# Patient Record
Sex: Male | Born: 1970 | Race: White | Hispanic: No | Marital: Married | Smoking: Former smoker
Health system: Southern US, Community
[De-identification: ages and names within clinical notes are randomized; demographics above are authoritative.]

## PROBLEM LIST (undated history)

## (undated) HISTORY — PX: KNEE SURGERY: SHX244

---

## 2009-02-21 ENCOUNTER — Encounter: Admission: RE | Admit: 2009-02-21 | Discharge: 2009-02-21 | Payer: Self-pay | Admitting: Unknown Physician Specialty

## 2009-06-05 ENCOUNTER — Encounter: Admission: RE | Admit: 2009-06-05 | Discharge: 2009-06-05 | Payer: Self-pay | Admitting: Unknown Physician Specialty

## 2009-12-03 ENCOUNTER — Encounter: Admission: RE | Admit: 2009-12-03 | Discharge: 2009-12-03 | Payer: Self-pay | Admitting: Unknown Physician Specialty

## 2012-02-26 ENCOUNTER — Other Ambulatory Visit: Payer: Self-pay | Admitting: Family Medicine

## 2012-02-26 ENCOUNTER — Ambulatory Visit (INDEPENDENT_AMBULATORY_CARE_PROVIDER_SITE_OTHER): Payer: BC Managed Care – PPO

## 2012-02-26 DIAGNOSIS — J328 Other chronic sinusitis: Secondary | ICD-10-CM

## 2012-02-26 DIAGNOSIS — R51 Headache: Secondary | ICD-10-CM

## 2012-02-26 DIAGNOSIS — J3489 Other specified disorders of nose and nasal sinuses: Secondary | ICD-10-CM

## 2012-02-26 DIAGNOSIS — R52 Pain, unspecified: Secondary | ICD-10-CM

## 2013-01-31 ENCOUNTER — Encounter (HOSPITAL_COMMUNITY): Payer: Self-pay | Admitting: Psychiatry

## 2013-01-31 ENCOUNTER — Ambulatory Visit (INDEPENDENT_AMBULATORY_CARE_PROVIDER_SITE_OTHER): Payer: Private Health Insurance - Indemnity | Admitting: Psychiatry

## 2013-01-31 ENCOUNTER — Encounter (INDEPENDENT_AMBULATORY_CARE_PROVIDER_SITE_OTHER): Payer: Self-pay

## 2013-01-31 VITALS — BP 120/73 | HR 71 | Ht 73.0 in | Wt 206.0 lb

## 2013-01-31 DIAGNOSIS — F332 Major depressive disorder, recurrent severe without psychotic features: Secondary | ICD-10-CM

## 2013-01-31 DIAGNOSIS — F411 Generalized anxiety disorder: Secondary | ICD-10-CM

## 2013-01-31 MED ORDER — HYDROXYZINE HCL 25 MG PO TABS: 25.0000 mg | ORAL_TABLET | Freq: Three times a day (TID) | ORAL | Status: DC | PRN

## 2013-01-31 MED ORDER — LITHIUM CARBONATE 150 MG PO CAPS: ORAL_CAPSULE | ORAL | Status: DC
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
## 2013-02-07 ENCOUNTER — Telehealth (HOSPITAL_COMMUNITY): Payer: Self-pay

## 2013-02-07 NOTE — Telephone Encounter (Signed)
Acknwledged

## 2013-02-15 NOTE — Telephone Encounter (Signed)
Message copied by Larena Sox on Wed Feb 15, 2013  6:22 PM ------      Message from: Larena Sox      Created: Tue Feb 07, 2013 11:06 PM       Call patient regarding lithium ------

## 2013-02-15 NOTE — Telephone Encounter (Signed)
Called patient. Patent denies SI/HI/AVH. He reports he is taking 450 mg of lithium daily.  He reports that he has been sleeping to avoid life in general. He denies and side effects. He feels he is doing better, overall.

## 2013-02-21 ENCOUNTER — Telehealth (HOSPITAL_COMMUNITY): Payer: Self-pay

## 2013-02-21 DIAGNOSIS — F411 Generalized anxiety disorder: Secondary | ICD-10-CM

## 2013-02-21 DIAGNOSIS — F332 Major depressive disorder, recurrent severe without psychotic features: Secondary | ICD-10-CM

## 2013-02-22 NOTE — Telephone Encounter (Signed)
Left message, as no answer.

## 2013-02-22 NOTE — Telephone Encounter (Signed)
Second attempt to call patient. No answer from patient family as well.

## 2013-02-24 MED ORDER — HYDROXYZINE HCL 25 MG PO TABS: 25.0000 mg | ORAL_TABLET | Freq: Every evening | ORAL | Status: DC | PRN

## 2013-02-24 MED ORDER — LITHIUM CARBONATE ER 300 MG PO TBCR: 300.0000 mg | EXTENDED_RELEASE_TABLET | Freq: Two times a day (BID) | ORAL | Status: DC

## 2013-02-24 MED ORDER — CITALOPRAM HYDROBROMIDE 40 MG PO TABS: 40.0000 mg | ORAL_TABLET | Freq: Every day | ORAL | Status: DC

## 2013-02-24 NOTE — Telephone Encounter (Addendum)
Called patient. Patient denies any current SI/AVH. He seems to be doing better with 600 mg of lithium. Will switch to extended release lithium 300 mg BID.

## 2013-02-24 NOTE — Addendum Note (Signed)
Addended by: Larena SoxPUTHUVEL, Jaina Morin J on: 02/24/2013 06:02 PM   Modules accepted: Orders, Medications

## 2013-03-10 ENCOUNTER — Telehealth (HOSPITAL_COMMUNITY): Payer: Self-pay

## 2013-03-10 NOTE — Telephone Encounter (Signed)
Called patient's wife. The patient is doing well now. No change of medication. She reports that her mother has been very negative.

## 2013-03-14 ENCOUNTER — Ambulatory Visit (INDEPENDENT_AMBULATORY_CARE_PROVIDER_SITE_OTHER): Payer: Private Health Insurance - Indemnity | Admitting: Psychiatry

## 2013-03-14 ENCOUNTER — Encounter (HOSPITAL_COMMUNITY): Payer: Self-pay | Admitting: Psychiatry

## 2013-03-14 ENCOUNTER — Encounter (INDEPENDENT_AMBULATORY_CARE_PROVIDER_SITE_OTHER): Payer: Self-pay

## 2013-03-14 VITALS — BP 135/77 | HR 65 | Wt 213.0 lb

## 2013-03-14 DIAGNOSIS — F411 Generalized anxiety disorder: Secondary | ICD-10-CM

## 2013-03-14 DIAGNOSIS — F332 Major depressive disorder, recurrent severe without psychotic features: Secondary | ICD-10-CM
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
## 2013-03-17 DIAGNOSIS — F332 Major depressive disorder, recurrent severe without psychotic features: Secondary | ICD-10-CM | POA: Insufficient documentation

## 2013-04-05 ENCOUNTER — Telehealth (HOSPITAL_COMMUNITY): Payer: Self-pay | Admitting: Psychiatry

## 2013-04-05 NOTE — Telephone Encounter (Signed)
The patient wife's reports some decrease in mood secondary to his accountant telling him that he owed money for taxes. He appears more depressed.   Will increase Lithium to 300 mg TID. Consider change to Prozac.

## 2013-04-07 ENCOUNTER — Telehealth (HOSPITAL_COMMUNITY): Payer: Self-pay | Admitting: Psychiatry

## 2013-04-07 NOTE — Telephone Encounter (Signed)
The patient wife that the patient is too drowsy with 300 mg TID. Will change to 300 mg and 600 mg QHS daily.Asked patient's wife to take patient to ED with any SI/HI/AVH or medication side effects.

## 2013-04-14 ENCOUNTER — Telehealth (HOSPITAL_COMMUNITY): Payer: Self-pay

## 2013-04-14 NOTE — Telephone Encounter (Signed)
The patient reports he has been doing well.

## 2013-04-18 ENCOUNTER — Ambulatory Visit (INDEPENDENT_AMBULATORY_CARE_PROVIDER_SITE_OTHER): Payer: Private Health Insurance - Indemnity | Admitting: Psychiatry

## 2013-04-18 ENCOUNTER — Encounter (HOSPITAL_COMMUNITY): Payer: Self-pay | Admitting: Psychiatry

## 2013-04-18 ENCOUNTER — Encounter (INDEPENDENT_AMBULATORY_CARE_PROVIDER_SITE_OTHER): Payer: Self-pay

## 2013-04-18 VITALS — BP 113/77 | HR 62 | Wt 216.0 lb

## 2013-04-18 DIAGNOSIS — F332 Major depressive disorder, recurrent severe without psychotic features: Secondary | ICD-10-CM

## 2013-04-18 DIAGNOSIS — F411 Generalized anxiety disorder: Secondary | ICD-10-CM

## 2013-04-18 MED ORDER — CITALOPRAM HYDROBROMIDE 40 MG PO TABS: 40.0000 mg | ORAL_TABLET | Freq: Every day | ORAL | Status: DC

## 2013-04-18 MED ORDER — LITHIUM CARBONATE ER 300 MG PO TBCR: 300.0000 mg | EXTENDED_RELEASE_TABLET | ORAL | Status: DC
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
## 2013-04-19 LAB — LITHIUM LEVEL: LITHIUM LVL: 0.2 meq/L — AB (ref 0.80–1.40)

## 2013-04-19 LAB — CREATININE, SERUM: CREATININE: 1.09 mg/dL (ref 0.50–1.35)

## 2013-04-19 LAB — TSH: TSH: 2.621 u[IU]/mL (ref 0.350–4.500)

## 2013-04-19 LAB — BUN: BUN: 15 mg/dL (ref 6–23)

## 2013-04-19 LAB — VITAMIN D 25 HYDROXY (VIT D DEFICIENCY, FRACTURES): Vit D, 25-Hydroxy: 29 ng/mL — ABNORMAL LOW (ref 30–89)

## 2013-04-19 LAB — BUN+CREAT: BUN / CREAT RATIO: 13.8 ratio

## 2013-04-21 LAB — VITAMIN D 1,25 DIHYDROXY
VITAMIN D 1, 25 (OH) TOTAL: 52 pg/mL (ref 18–72)
VITAMIN D3 1, 25 (OH): 52 pg/mL
Vitamin D2 1, 25 (OH)2: <8 pg/mL

## 2013-05-16 ENCOUNTER — Telehealth (HOSPITAL_COMMUNITY): Payer: Self-pay

## 2013-05-16 DIAGNOSIS — F411 Generalized anxiety disorder: Secondary | ICD-10-CM

## 2013-05-16 DIAGNOSIS — F332 Major depressive disorder, recurrent severe without psychotic features: Secondary | ICD-10-CM

## 2013-05-16 MED ORDER — LITHIUM CARBONATE ER 300 MG PO TBCR: 300.0000 mg | EXTENDED_RELEASE_TABLET | Freq: Every day | ORAL | Status: DC

## 2013-05-16 MED ORDER — CITALOPRAM HYDROBROMIDE 40 MG PO TABS: 40.0000 mg | ORAL_TABLET | Freq: Every day | ORAL | Status: DC

## 2013-05-16 NOTE — Telephone Encounter (Signed)
Continue lithium 300 mg and Citalopram 40 mg. Add vitamin supplement with Vitamin D.

## 2013-05-23 ENCOUNTER — Encounter (INDEPENDENT_AMBULATORY_CARE_PROVIDER_SITE_OTHER): Payer: Self-pay

## 2013-05-23 ENCOUNTER — Ambulatory Visit (INDEPENDENT_AMBULATORY_CARE_PROVIDER_SITE_OTHER): Payer: Private Health Insurance - Indemnity | Admitting: Psychiatry

## 2013-05-23 ENCOUNTER — Encounter (HOSPITAL_COMMUNITY): Payer: Self-pay | Admitting: Psychiatry

## 2013-05-23 VITALS — BP 128/90 | HR 74 | Wt 220.0 lb

## 2013-05-23 DIAGNOSIS — F332 Major depressive disorder, recurrent severe without psychotic features: Secondary | ICD-10-CM

## 2013-05-23 DIAGNOSIS — F411 Generalized anxiety disorder: Secondary | ICD-10-CM

## 2013-05-23 MED ORDER — CITALOPRAM HYDROBROMIDE 40 MG PO TABS: 40.0000 mg | ORAL_TABLET | Freq: Every day | ORAL | Status: AC

## 2013-05-23 MED ORDER — LITHIUM CARBONATE ER 300 MG PO TBCR: 300.0000 mg | EXTENDED_RELEASE_TABLET | Freq: Every day | ORAL | Status: AC
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
## 2013-05-27 ENCOUNTER — Encounter (HOSPITAL_COMMUNITY): Payer: Self-pay | Admitting: Psychiatry

## 2013-06-14 IMAGING — CT CT PARANASAL SINUSES LIMITED
2 of 5 series · 15 of 37 positions shown, 18 images · non-contrast
Comparison: None.

CLINICAL DATA: Sinus pressure, congestion, frontal headache

CT PARANASAL SINUS LIMITED WITHOUT CONTRAST
TECHNIQUE: Multidetector CT images of the paranasal sinuses were
obtained in a single plane without contrast.

[Series 102: cor bone · axial · 0.47mm/px · z∈[-17,+117]mm · 12 of 60 slices shown, 15 images]
[im 3/60  brain]
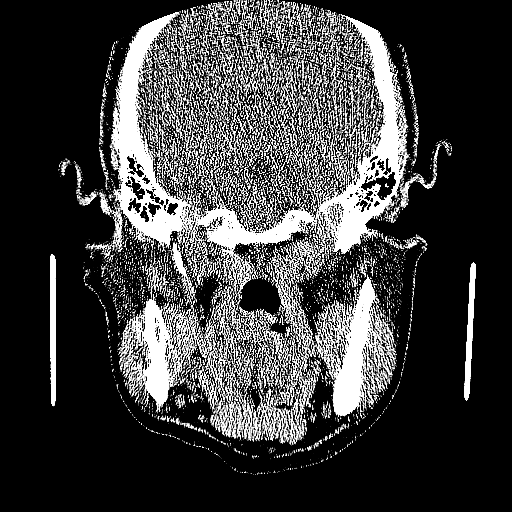
[im 3/60  bone]
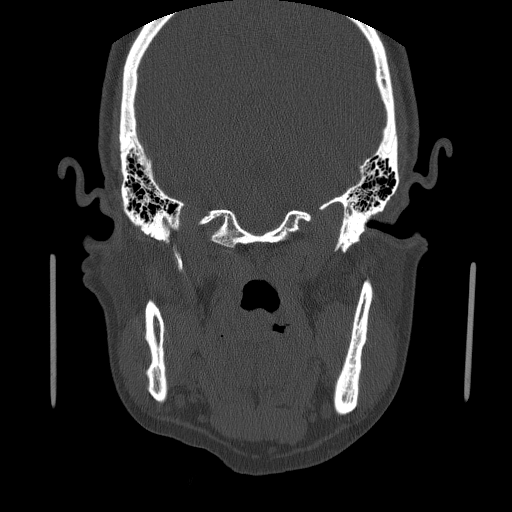
[im 9/60  bone]
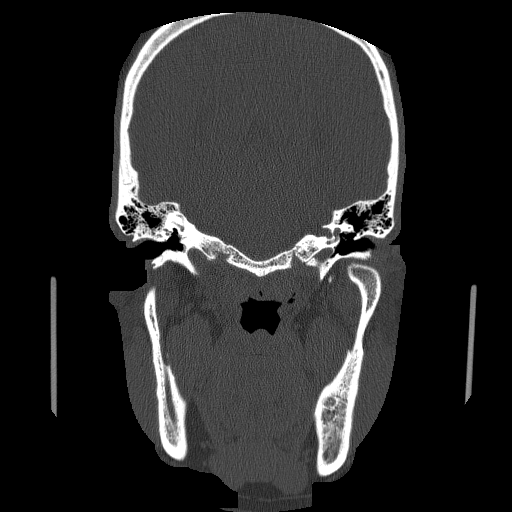
[im 12/60  bone]
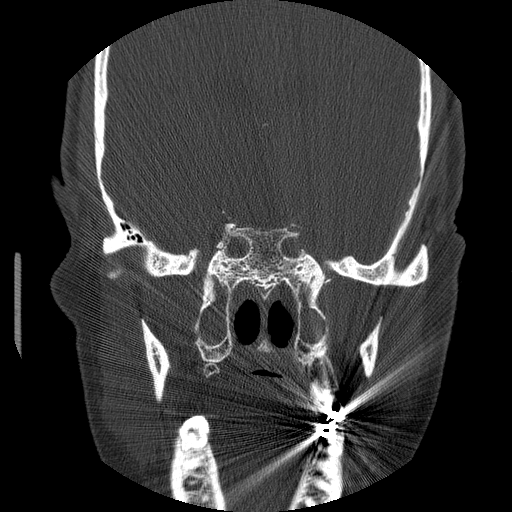
[im 18/60  bone]
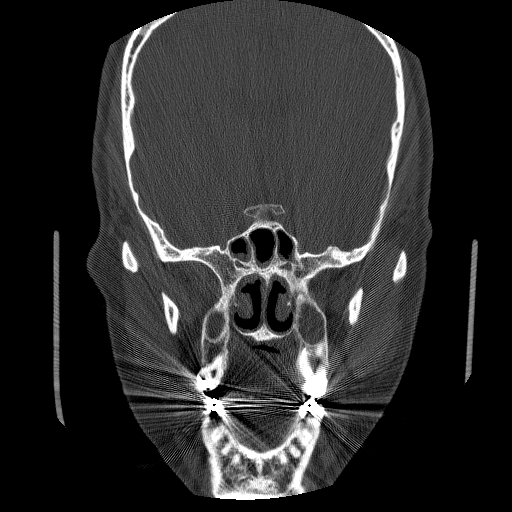
[im 24/60  brain]
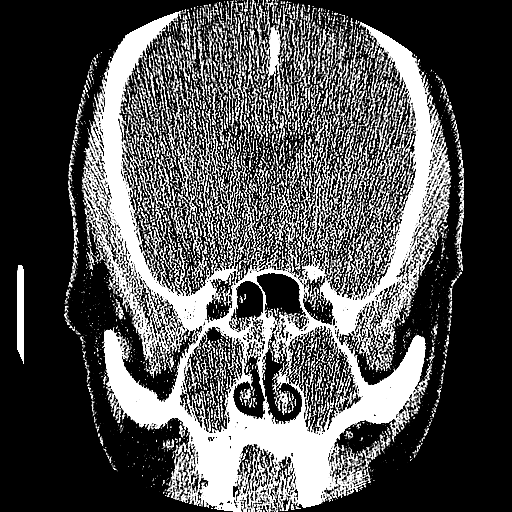
[im 24/60  bone]
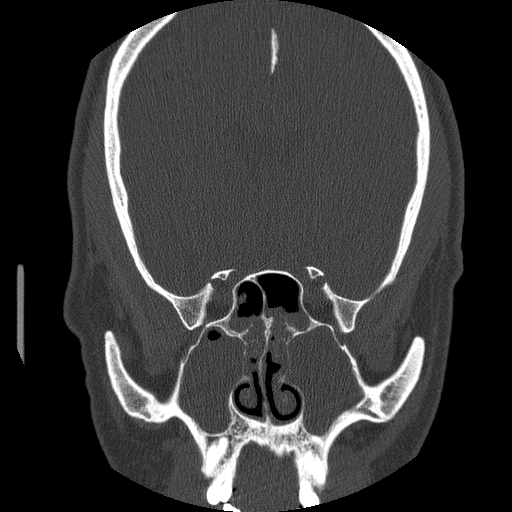
[im 27/60  bone]
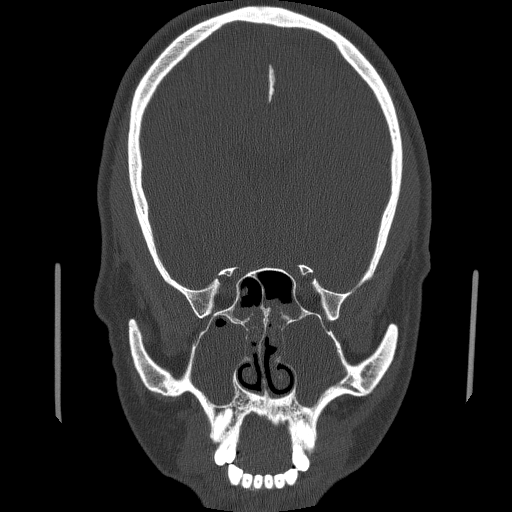
[im 33/60  bone]
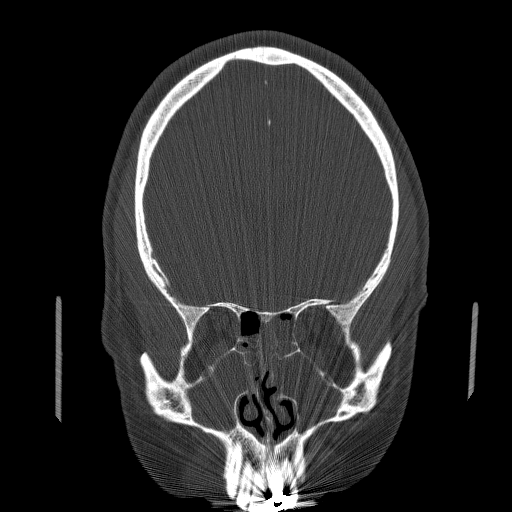
[im 36/60  bone]
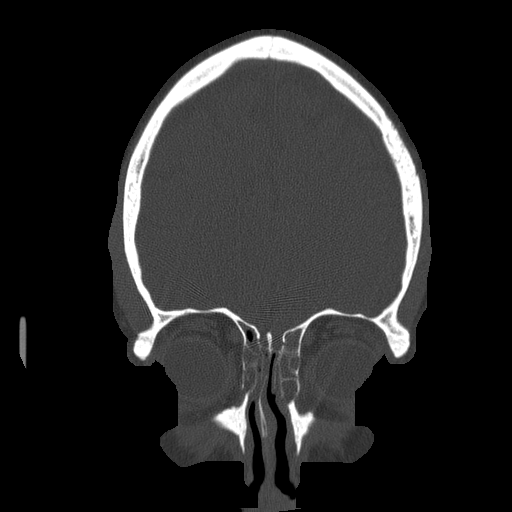
[im 42/60  brain]
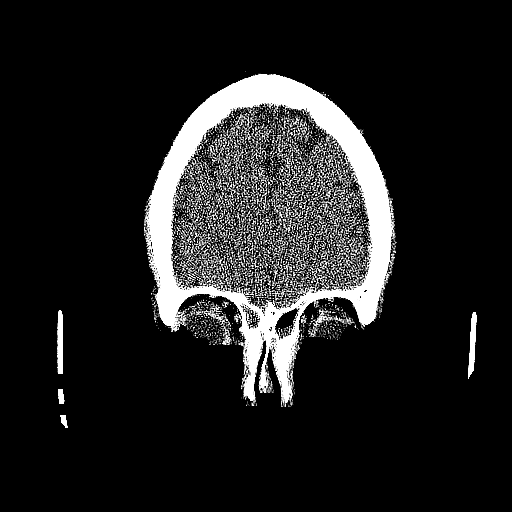
[im 42/60  bone]
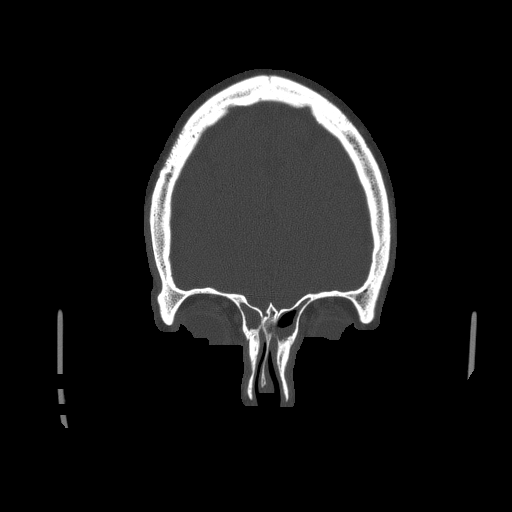
[im 48/60  bone]
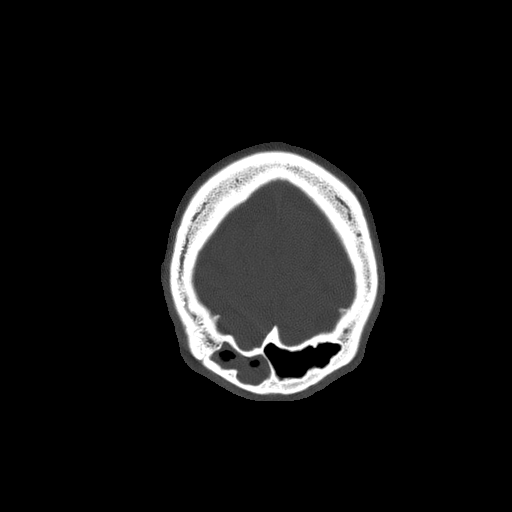
[im 51/60  bone]
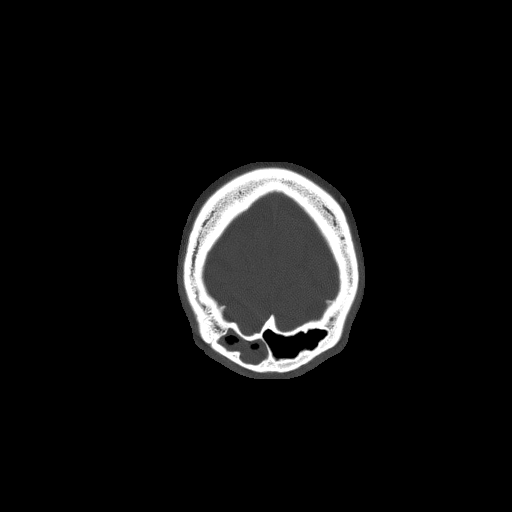
[im 57/60  bone]
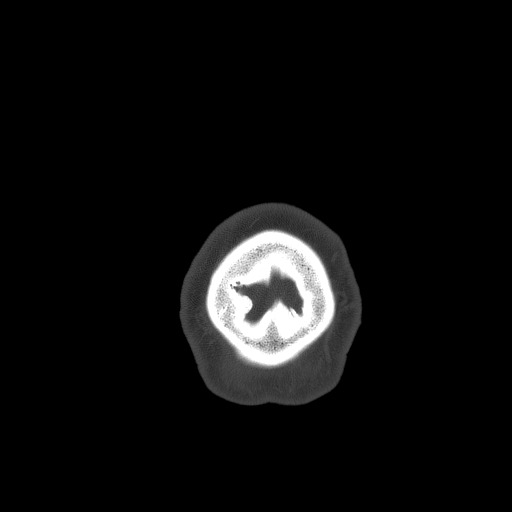

[Series 201: sag · sagittal · 0.39mm/px · 3 of 138 slices shown]
[im 35/138  bone]
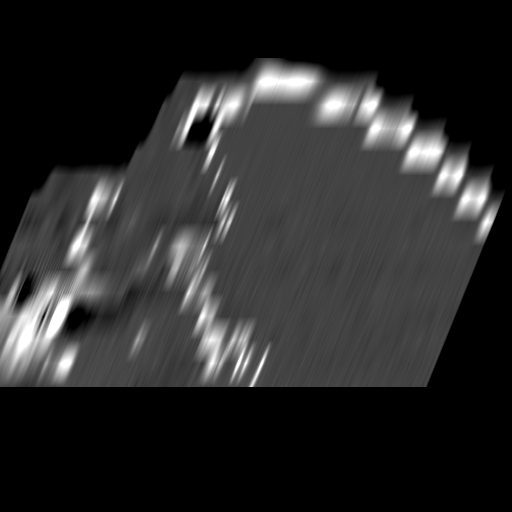
[im 69/138  bone]
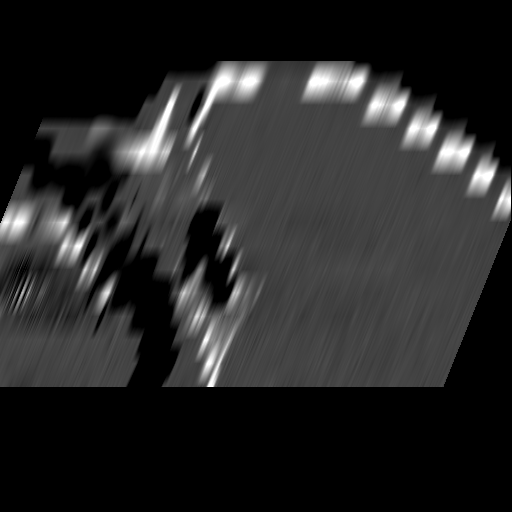
[im 103/138  bone]
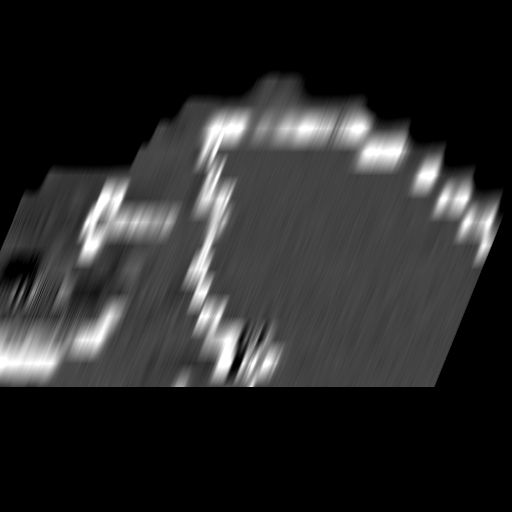

[15 of 37 positions shown; findings below may reference images not displayed]

FINDINGS: The maxillary sinuses are completely opacified as are
the ethmoid air cells.  There is mucosal thickening throughout the
frontal sinuses primarily on the left with mucosal thickening
throughout the sphenoid sinus partitions.  These findings are
consistent with severe pansinusitis.  The nasal airway is partially
occluded by nasal mucosal edema, and mucosal edema occludes both
olfactory recesses.  No bony abnormality is seen.
IMPRESSION: Severe pansinusitis.

## 2013-10-31 ENCOUNTER — Telehealth (HOSPITAL_COMMUNITY): Payer: Self-pay

## 2013-10-31 NOTE — Telephone Encounter (Signed)
error
